# Patient Record
Sex: Male | Born: 1945 | Race: Black or African American | Hispanic: No | Marital: Married | Smoking: Current every day smoker
Health system: Southern US, Community
[De-identification: ages and names within clinical notes are randomized; demographics above are authoritative.]

## PROBLEM LIST (undated history)

## (undated) DIAGNOSIS — I10 Essential (primary) hypertension: Secondary | ICD-10-CM

## (undated) DIAGNOSIS — N189 Chronic kidney disease, unspecified: Secondary | ICD-10-CM

## (undated) DIAGNOSIS — E119 Type 2 diabetes mellitus without complications: Secondary | ICD-10-CM

## (undated) DIAGNOSIS — I517 Cardiomegaly: Secondary | ICD-10-CM

## (undated) HISTORY — DX: Essential (primary) hypertension: I10

## (undated) HISTORY — PX: ANKLE SURGERY: SHX546

## (undated) HISTORY — DX: Chronic kidney disease, unspecified: N18.9

## (undated) HISTORY — DX: Type 2 diabetes mellitus without complications: E11.9

## (undated) HISTORY — DX: Cardiomegaly: I51.7

---

## 1999-01-06 ENCOUNTER — Ambulatory Visit (HOSPITAL_COMMUNITY): Admission: RE | Admit: 1999-01-06 | Discharge: 1999-01-06 | Payer: Self-pay | Admitting: Gastroenterology

## 2000-05-31 ENCOUNTER — Observation Stay (HOSPITAL_COMMUNITY): Admission: EM | Admit: 2000-05-31 | Discharge: 2000-05-31 | Payer: Self-pay | Admitting: Emergency Medicine

## 2000-05-31 ENCOUNTER — Encounter: Payer: Self-pay | Admitting: Emergency Medicine

## 2000-05-31 ENCOUNTER — Encounter: Payer: Self-pay | Admitting: Cardiology

## 2000-06-04 ENCOUNTER — Ambulatory Visit (HOSPITAL_COMMUNITY): Admission: RE | Admit: 2000-06-04 | Discharge: 2000-06-04 | Payer: Self-pay | Admitting: Cardiology

## 2000-06-04 ENCOUNTER — Encounter: Payer: Self-pay | Admitting: Cardiology

## 2005-09-02 ENCOUNTER — Emergency Department (HOSPITAL_COMMUNITY): Admission: EM | Admit: 2005-09-02 | Discharge: 2005-09-03 | Payer: Self-pay | Admitting: *Deleted

## 2005-11-05 ENCOUNTER — Encounter: Admission: RE | Admit: 2005-11-05 | Discharge: 2005-11-05 | Payer: Self-pay | Admitting: Internal Medicine

## 2006-01-13 ENCOUNTER — Emergency Department (HOSPITAL_COMMUNITY): Admission: EM | Admit: 2006-01-13 | Discharge: 2006-01-13 | Payer: Self-pay | Admitting: Family Medicine

## 2006-01-17 ENCOUNTER — Encounter: Admission: RE | Admit: 2006-01-17 | Discharge: 2006-01-17 | Payer: Self-pay | Admitting: Internal Medicine

## 2006-01-30 ENCOUNTER — Encounter: Admission: RE | Admit: 2006-01-30 | Discharge: 2006-01-30 | Payer: Self-pay | Admitting: Internal Medicine

## 2007-08-23 IMAGING — CR DG LUMBAR SPINE COMPLETE 4+V
5 series · 5 of 5 positions shown · non-contrast
Comparison: none

CLINICAL DATA: Severe low back pain and right side pain.   No injury.
 LUMBAR SPINE ? 5 VIEW:

[view not recorded (1 of 5)]
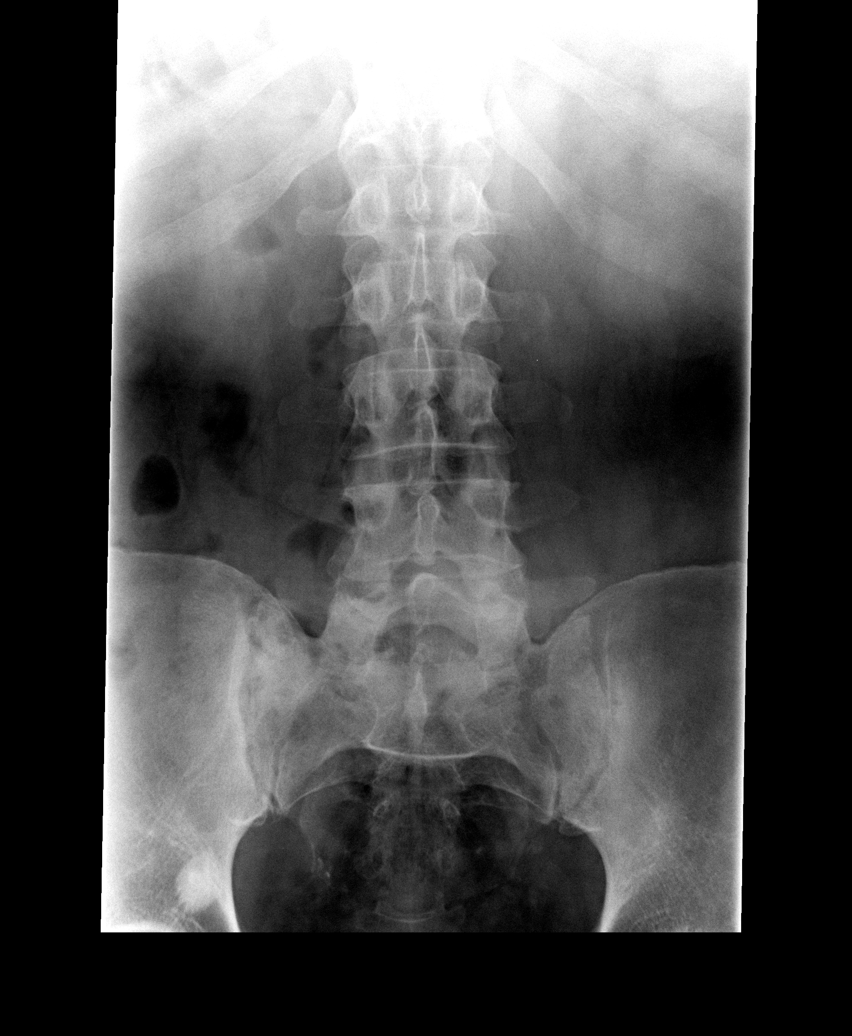

[view not recorded (2 of 5)]
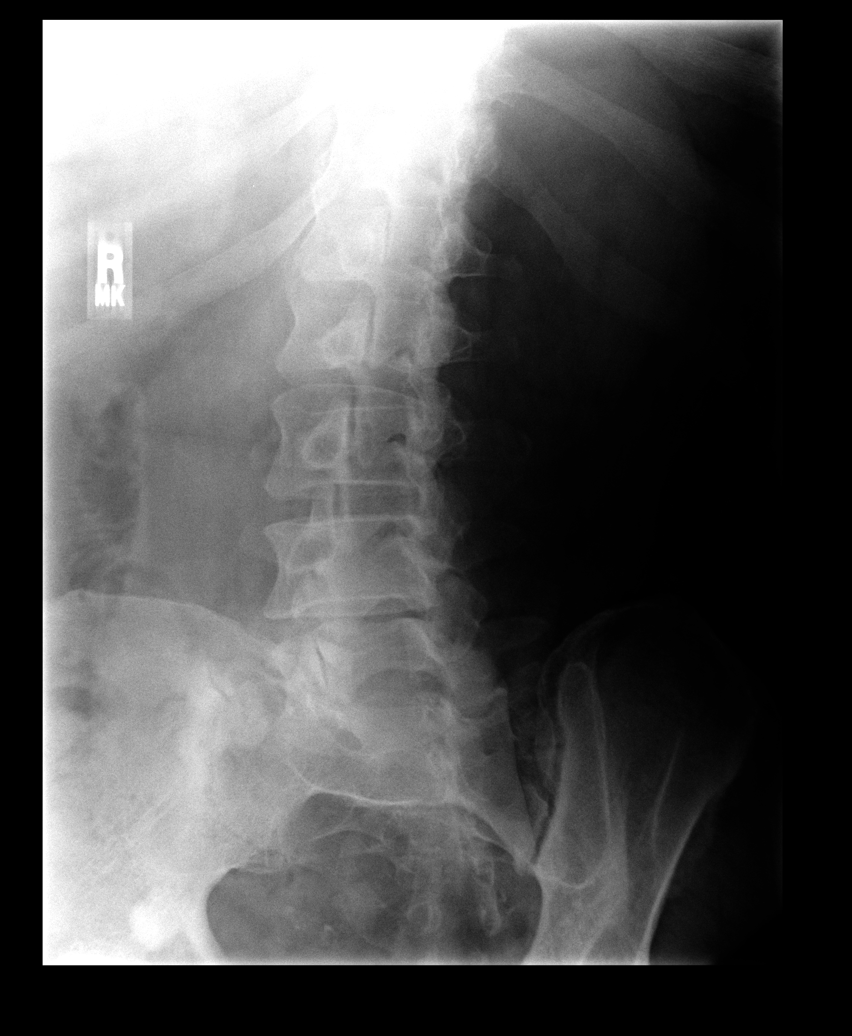

[view not recorded (3 of 5)]
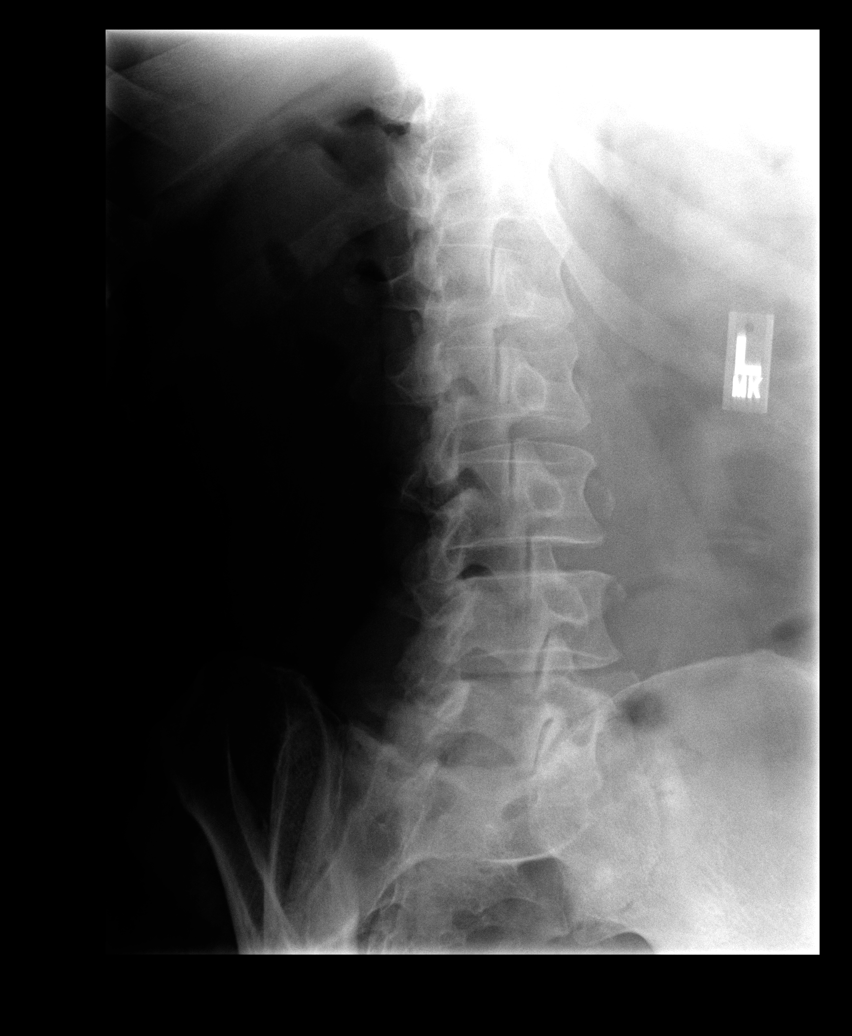

[view not recorded (4 of 5)]
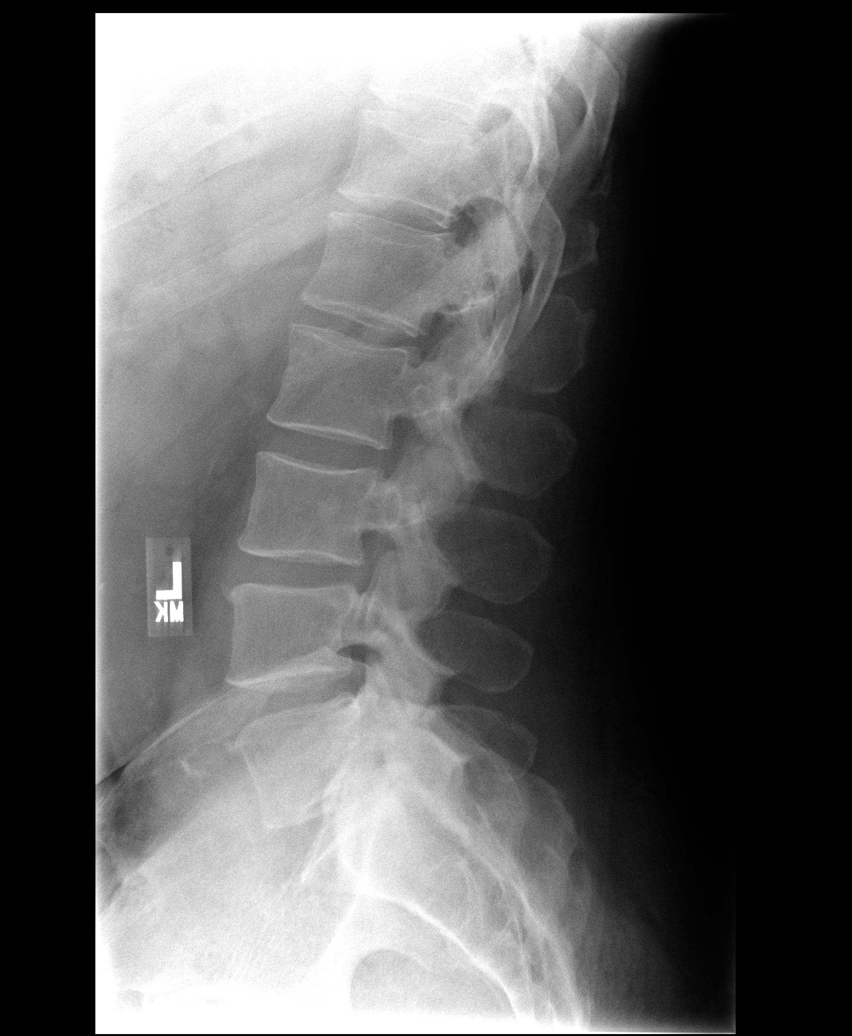

[view not recorded (5 of 5)]
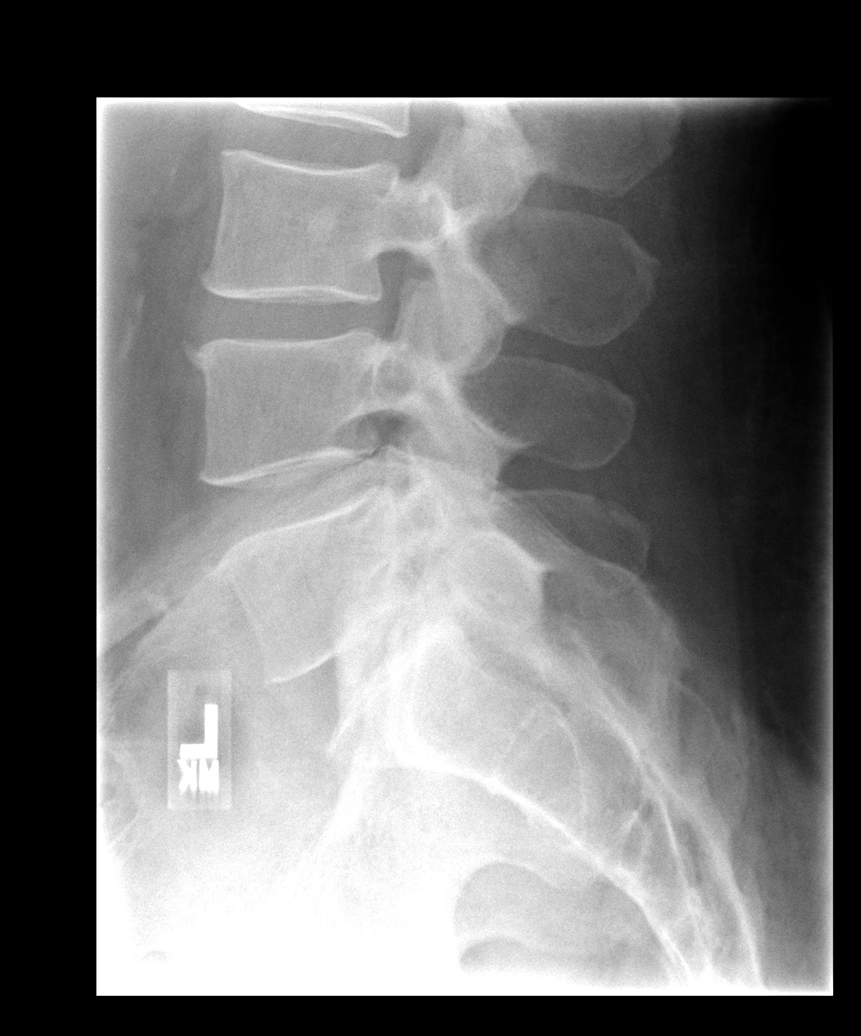

[5 of 5 positions shown; findings below may reference images not displayed]

FINDINGS: There are five lumbar type vertebra.  There is mild L3-4 and L4-5 interspace narrowing.  There are no fractures, subluxations or destructive changes. 
 There is an area of increased density associated with the right sacrum and also medially within the right iliac bone and a subtle area of increased density associated with the L3 vertebral body.  Nuclear medicine bone scan may be helpful in further evaluation to determine if these are associated with increased uptake (Does the patient have a history of prostate cancer?).
IMPRESSION: L3-4 and L4-5 degenerative disk space narrowing.  Areas of increased density associated with the right sacrum, right iliac bone and L3 vertebral body.  Consider nuclear medicine bone scan to determine if these are associated with increased uptake as discussed above.

## 2009-10-20 ENCOUNTER — Ambulatory Visit (HOSPITAL_COMMUNITY): Admission: RE | Admit: 2009-10-20 | Discharge: 2009-10-20 | Payer: Self-pay | Admitting: Gastroenterology

## 2011-02-16 NOTE — H&P (Signed)
[place]  Patient:    Steve Austin, Steve Austin                         MRN: [id number] Adm. Date:  [id number] Attending:  Corliss Marcus Dictator:   Anselm Lis, N.P. CC:         Winn Jock. Earl Gala, M.D.                         History and Physical  PRIMARY CARE Breezie Micucci:  Winn Jock. Earl Gala, M.D.  DATE OF BIRTH:  1946-09-04.  HISTORY OF PRESENT ILLNESS:  [name] is a very pleasant 65 year old, history of hypertension, obesity, "abnormal EKG," hypertensive cardiac disease with cardiac enlargement by serial echocardiograms, last dated April 2000. This gentleman developed initial right low scapular chest pain, which evolved subsequently to epigastric fullness with associated shortness of breath. Notably the discomfort exacerbated with standing erect and markedly eased off with bending over.  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  He was sent to the [facility] Emergency Room about 5:30 this morning and noted easing of symptoms with flat-lying position.  Recurrence only when getting up to urinate, standing erect.  He has no associated neurologic defect. Known cardiac risk factors:  Age, obesity, history of hypertension, possible positive family history of CAD, tobacco use.  PAST MEDICAL HISTORY: 1. Urinary retention, which is improved with Flomax, followed by Dr.    Wanda Plump. 2. History of hypertension for approximately 10 years with associated    hypertensive heart disease; last echo March 2000 revealed moderately severe    LVH with preserved LV function, with EF of 60-70%.  Dilated left atrium at    5.1 cm; dilated aortic root at 4.1, progressed from 3.8, study in 1998. 3. History of abnormal EKG associated with hypertensive heart disease. 4. Right ankle fracture with subsequent surgical repair,  with eight pins    placed. 5. Tobacco use. 6. Chronic lower extremity swelling, for which he takes TED hose, and may be    related to obesity. 7. Obesity.  Denies problems with diabetes mellitus, cancer, asthma, nor thyroid disease.  ALLERGIES:  No known drug allergies.  MEDICATIONS:  Avapro 150 mg p.o. q.d., Norvasc 10 mg p.o. q.d., enteric-coated aspirin 325 mg a day, Flomax q.d. (uncertain dose).  SOCIAL HISTORY:  Tobacco:  One-half pack per day off and on lifelong, more recently for the last five years.  ETOH:  Negative.  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  Married 34 years, has four children, ages 21-34, alive and well.  FAMILY HISTORY:  Fathers whereabouts and health history are unknown.  His mother died at age 31 of "heart problems," onset at age 67s.  One sister who died of AIDS at age 64.  One sister who has survived breast cancer.  REVIEW OF SYSTEMS:  As in HPI and past medical history.  Otherwise, denies problems with lightheadedness, syncope, dizziness, nor near-syncopal episodes. Does wear glasses for reading.  Negative hearing problems.  Good dentition. Negative dysphagia for food or fluids.  No history of GERD symptoms.  No prior history of chest discomfort preceding that at presentation.  Negative constipation, diarrhea, bright red blood p.r., nor melena.  History of urinary retention but improved with the use of Flomax.  Negative hematuria.  Denies arthritic-type complaints.  Chronic lower extremity swelling, improved with TED stockings, which he wears chronically.  PHYSICAL EXAMINATION:  VITAL SIGNS:  Blood pressure is 153/93, subsequent measurement 129/72, respiratory rate 15, heart rate 76, O2 saturation on 2 L nasal cannula is 95-96%.  GENERAL:  He is an obese, middle-aged male in no apparent distress.  His wife is in attendance.  HEENT:  Brisk bilateral carotid upstroke without  bruits.  No significant JVD. No thyromegaly.  CHEST:  Very diminished but clear bilaterally.  Marked exacerbation of anterior chest discomfort with leaning forward.  CARDIAC:  Regular rate and rhythm without murmur, rub, nor gallop.  Normal S1 and S2.  ABDOMEN:  Soft, obese, normoactive bowel sounds.  Negative abdominal aorta, renal or femoral bruit.  Nontender to applied pressure.  Negative masses nor organomegaly appreciated, though difficult exam secondary to obesity.  NEUROLOGIC:  Cranial nerves II-XII grossly intact.  Alert and oriented x 3. Neurologic exam was nonfocal.  GENITAL/RECTAL:  Deferred.  LABORATORY DATA:  A chest x-ray revealed cardiac enlargement, no active disease.  Hemoglobin 14.3, WBC 8, platelets 274.  PT of 12.3, INR 0.9, and PTT of 30.  Sodium 142, potassium 3.6, chloride 105, CO2 18, creatinine 1.4, glucose 99, BUN 18.  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  ST upsloping with asymmetric T-wave inversion V3.  ST depression of 1 mm V3 with asymmetric T-wave inversion.  ST depression with slightly downsloping ST segment and asymmetric T-wave inversion V5, V6, inferiorly.  T-wave inversion V1, and T-wave widening aVL.  IMPRESSION: 1. Atypical chest pain in a 65 year old with history of hypertension, tobacco    use, and possible family history for coronary artery disease.  EKG with    T-wave abnormalities which are old.  He does have a history of left    ventricular hypertrophy.  The pain starts in the back and radiates to chest    when standing. 2. Tobacco use. 3. Hypertension. 4. Urinary retention, improved with Flomax.  PLAN: 1. Obtain a chest CT to rule out pulmonary emboli and rule out aortic    dissection or other aortic pathology. 2. Admit to telemetry, rule out myocardial ischemia/infarction with serial    cardiac enzymes and daily EKGs. 3. If CT is negative, will evaluate further for evidence of coronary  artery    disease.DD:  05/31/00 TD:  05/31/00 Job: [id number]

## 2013-10-16 ENCOUNTER — Encounter (INDEPENDENT_AMBULATORY_CARE_PROVIDER_SITE_OTHER): Payer: Self-pay | Admitting: Surgery

## 2013-10-20 ENCOUNTER — Encounter (INDEPENDENT_AMBULATORY_CARE_PROVIDER_SITE_OTHER): Payer: Self-pay | Admitting: Surgery

## 2013-10-20 ENCOUNTER — Ambulatory Visit (INDEPENDENT_AMBULATORY_CARE_PROVIDER_SITE_OTHER): Payer: Medicare Other | Admitting: Surgery

## 2013-10-20 ENCOUNTER — Encounter (INDEPENDENT_AMBULATORY_CARE_PROVIDER_SITE_OTHER): Payer: Self-pay

## 2013-10-20 VITALS — BP 160/100 | HR 108 | Temp 97.5°F | Resp 15 | Ht 76.0 in | Wt 274.4 lb

## 2013-10-20 DIAGNOSIS — D1779 Benign lipomatous neoplasm of other sites: Secondary | ICD-10-CM

## 2013-10-20 DIAGNOSIS — D171 Benign lipomatous neoplasm of skin and subcutaneous tissue of trunk: Secondary | ICD-10-CM | POA: Insufficient documentation

## 2013-10-20 NOTE — Progress Notes (Signed)
Patient ID: Steve Austin, male   DOB: [birth year], 68 y.o.   MRN: [id number]  Chief Complaint  Patient presents with  . New Evaluation    eval Lipoma on back    HPI Steve Austin is a 68 y.o. male.  Referred by Dr. Carlena Sax for evaluation of lipoma of the back  HPI This is a 68 year old male who presents with several years of an enlarging mass on the upper part of his back near the base of his neck. This has caused some discomfort. His wife seems to think that it is getting larger and more noticeable. This has never become infected. He presents now to discuss excision of this area. Past Medical History  Diagnosis Date  . Diabetes mellitus without complication   . Chronic kidney disease   . Hypertension   . LVH (left ventricular hypertrophy)     PSH;  Ankle surgery  History reviewed. No pertinent family history.  Social History History  Substance Use Topics  . Smoking status: Current Every Day Smoker -- 0.50 packs/day    Types: Cigarettes  . Smokeless tobacco: Never Used  . Alcohol Use: No    No Known Allergies  Current Outpatient Prescriptions  Medication Sig Dispense Refill  . amLODipine (NORVASC) 10 MG tablet Take 10 mg by mouth daily.      Marland Kitchen aspirin 81 MG tablet Take 81 mg by mouth daily.      Marland Kitchen atorvastatin (LIPITOR) 20 MG tablet Take 20 mg by mouth daily.      . fluticasone (FLONASE) 50 MCG/ACT nasal spray Place into both nostrils daily.      Marland Kitchen glucose monitoring kit (FREESTYLE) monitoring kit 1 each by Does not apply route as needed for other.      . losartan (COZAAR) 100 MG tablet Take 100 mg by mouth daily.      . metFORMIN (GLUCOPHAGE) 500 MG tablet Take by mouth 2 (two) times daily with a meal.      . mirabegron ER (MYRBETRIQ) 25 MG TB24 tablet Take 25 mg by mouth daily.       No current facility-administered medications for this visit.    Review of Systems Review of Systems  Constitutional: Negative for fever, chills and unexpected weight change.   HENT: Negative for congestion, hearing loss, sore throat, trouble swallowing and voice change.   Eyes: Negative for visual disturbance.  Respiratory: Negative for cough and wheezing.   Cardiovascular: Negative for chest pain, palpitations and leg swelling.  Gastrointestinal: Negative for nausea, vomiting, abdominal pain, diarrhea, constipation, blood in stool, abdominal distention, anal bleeding and rectal pain.  Genitourinary: Negative for hematuria and difficulty urinating.  Musculoskeletal: Negative for arthralgias.  Skin: Negative for rash and wound.  Neurological: Negative for seizures, syncope, weakness and headaches.  Hematological: Negative for adenopathy. Does not bruise/bleed easily.  Psychiatric/Behavioral: Negative for confusion.    Blood pressure 160/100, pulse 108, temperature 97.5 F (36.4 C), temperature source Temporal, resp. rate 15, height $RemoveBe'6\' 4"'pEBqJNOEo$  (1.93 m), weight 274 lb 6.4 oz (124.467 kg).  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  The surgical procedure has been discussed with the patient.  Potential risks, benefits, alternative treatments, and expected outcomes have been explained.  All of the patient's questions at this time have been answered.  The likelihood of reaching the patient's treatment goal is good.  The patient understand the proposed surgical procedure and wishes to proceed.         Raygen Dahm K. 10/20/2013, 12:42 PM

## 2013-10-29 ENCOUNTER — Encounter (INDEPENDENT_AMBULATORY_CARE_PROVIDER_SITE_OTHER): Payer: Self-pay

## 2013-11-03 ENCOUNTER — Telehealth (INDEPENDENT_AMBULATORY_CARE_PROVIDER_SITE_OTHER): Payer: Self-pay | Admitting: General Surgery

## 2013-11-03 NOTE — Telephone Encounter (Signed)
Dr. Maxwell Caul office just called Steve Austin Dr Maxwell Caul nurse and she going get the patient an apt to see heart doctor for cardiac clearance, she stated that she will schedule him at [facility] heart care. And call me back and she will call the patient

## 2013-11-03 NOTE — Telephone Encounter (Signed)
Called patient this morning and he stated that he called Dr Maxwell Caul and he is going to see if Dr Maxwell Caul going to get him to see a heart doctor. I told Mr. Steve Austin that Dr Fransico Him read his EKG and she may be the one who will clear him for surgery, I told the patient to call me back and ask for [facility] and I will pass this on to Dr Georgette Dover

## 2013-11-04 ENCOUNTER — Encounter: Payer: Self-pay | Admitting: Internal Medicine

## 2013-11-04 ENCOUNTER — Ambulatory Visit (INDEPENDENT_AMBULATORY_CARE_PROVIDER_SITE_OTHER): Payer: Medicare Other | Admitting: Internal Medicine

## 2013-11-04 VITALS — BP 140/80 | HR 81 | Ht 75.0 in | Wt 273.2 lb

## 2013-11-04 DIAGNOSIS — I10 Essential (primary) hypertension: Secondary | ICD-10-CM

## 2013-11-04 DIAGNOSIS — Z01810 Encounter for preprocedural cardiovascular examination: Secondary | ICD-10-CM

## 2013-11-04 DIAGNOSIS — R0602 Shortness of breath: Secondary | ICD-10-CM

## 2013-11-04 DIAGNOSIS — E669 Obesity, unspecified: Secondary | ICD-10-CM | POA: Insufficient documentation

## 2013-11-04 DIAGNOSIS — R9431 Abnormal electrocardiogram [ECG] [EKG]: Secondary | ICD-10-CM | POA: Insufficient documentation

## 2013-11-04 DIAGNOSIS — I517 Cardiomegaly: Secondary | ICD-10-CM

## 2013-11-04 DIAGNOSIS — E785 Hyperlipidemia, unspecified: Secondary | ICD-10-CM

## 2013-11-04 DIAGNOSIS — R0989 Other specified symptoms and signs involving the circulatory and respiratory systems: Secondary | ICD-10-CM

## 2013-11-04 DIAGNOSIS — Z720 Tobacco use: Secondary | ICD-10-CM | POA: Insufficient documentation

## 2013-11-04 DIAGNOSIS — E66811 Obesity, class 1: Secondary | ICD-10-CM | POA: Insufficient documentation

## 2013-11-04 DIAGNOSIS — R0609 Other forms of dyspnea: Secondary | ICD-10-CM | POA: Insufficient documentation

## 2013-11-04 NOTE — Patient Instructions (Signed)
Your physician has requested that you have an echocardiogram. Echocardiography is a painless test that uses sound waves to create images of your heart. It provides your doctor with information about the size and shape of your heart and how well your heart's chambers and valves are working. This procedure takes approximately one hour. There are no restrictions for this procedure.  Your physician has requested that you have en exercise stress myoview. For further information please visit [email or web address]. Please follow instruction sheet, as given.  Your physician recommends that you schedule a follow-up appointment in: 2-3 weeks, after your tests.

## 2013-11-04 NOTE — Progress Notes (Signed)
OFFICE NOTE  Chief Complaint:  Pre-operative exam, abnormal EKG  Primary Care Physician: Horton Finer, MD  HPI:  Steve Austin is a pleasant 68 year old male with a history of hypertension, dyslipidemia, DM2, tobacco abuse which is ongoing, obesity, and family history of coronary disease. He was scheduled for a lipoma removal surgery and had an EKG which was abnormal. His surgery was then halted with plans for preoperative cardiovascular risk assessment. Steve Austin reports it is had an abnormal EKG in the past and in fact in 1998 he had an episode of shortness of breath and chest discomfort. He underwent a stress test around that time which was negative for ischemia but showed reduced EF of 43%.  He reports that subsequently he had echocardiograms through his primary care provider's office, but those results are not available today. His last study was over 5 years ago. He does not do much exertion, but does do some housework and maintains his church. He reports some shortness of breath with marked exertion.  His EKG was reviewed today and is markedly abnormal demonstrating voltage criteria for LVH and T-wave inversions which are deep in leads V3 through V6 and II, III, and AVF. He denies any chest pain with exertion, but again he does not do any significant physical activity.  PMHx:  Past Medical History  Diagnosis Date  . Diabetes mellitus without complication   . Chronic kidney disease   . Hypertension   . LVH (left ventricular hypertrophy)     Past Surgical History  Procedure Laterality Date  . Ankle surgery Right     FAMHx:  Family History  Problem Relation Age of Onset  . Heart disease Mother   . Hypertension Mother   . Breast cancer Paternal Grandmother   . Breast cancer Sister   . Diabetes Sister   . Breast cancer Daughter   . Valvular heart disease Son 48    aortic aneurysm repair    SOCHx:   reports that he has been smoking Cigarettes.  He has a 17.5  pack-year smoking history. He has never used smokeless tobacco. He reports that he does not drink alcohol or use illicit drugs.  ALLERGIES:  No Known Allergies  ROS: A comprehensive review of systems was negative except for: Respiratory: positive for dyspnea on exertion Integument/breast: positive for lipoma  HOME MEDS: Current Outpatient Prescriptions  Medication Sig Dispense Refill  . amLODipine (NORVASC) 10 MG tablet Take 10 mg by mouth daily.      Marland Kitchen aspirin 81 MG tablet Take 81 mg by mouth daily.      Marland Kitchen atorvastatin (LIPITOR) 20 MG tablet Take 20 mg by mouth daily.      Marland Kitchen darifenacin (ENABLEX) 15 MG 24 hr tablet Take 15 mg by mouth daily.      . fluticasone (FLONASE) 50 MCG/ACT nasal spray Place into both nostrils daily.      Marland Kitchen glucose monitoring kit (FREESTYLE) monitoring kit 1 each by Does not apply route as needed for other.      . losartan (COZAAR) 100 MG tablet Take 100 mg by mouth daily.      . metFORMIN (GLUCOPHAGE) 500 MG tablet Take by mouth 2 (two) times daily with a meal.       No current facility-administered medications for this visit.    LABS/IMAGING: No results found for this or any previous visit (from the past 48 hour(s)). No results found.  VITALS: BP 140/80  Pulse 81  Ht _0  (  1.905 m)  Wt 273 lb 3.2 oz (123.923 kg)  BMI 34.15 kg/m2  EXAM: General appearance: alert and no distress Neck: no carotid bruit, no JVD and lipoma over left trapezius area Lungs: clear to auscultation bilaterally Heart: regular rate and rhythm, S1, S2 normal, no murmur, click, rub or gallop Abdomen: soft, non-tender; bowel sounds normal; no masses,  no organomegaly and obese Extremities: edema trace and venous stasis dermatitis noted Pulses: 2+ and symmetric Skin: dark skin discoloration over shins Neurologic: Grossly normal Psych: Pleasant, normal  EKG: Normal sinus rhythm at 81, ALT is criteria for LVH, deep T-wave inversions anteriorly, laterally and  inferiorly  ASSESSMENT: 1. Indeterminate risk for surgery 2. Hypertension 3. Dyslipidemia 4. Tobacco abuse 5. Abnormal EKG suggesting possible ischemia versus LVH with repolarization abnormalities, consider variant apical hypertrophic cardiomyopathy 6. Obesity 7. DM2  PLAN: 1.   Steve Austin has a number of cardiac risk factors and an abnormal EKG which could be explained by ischemia or LVH with repolarization abnormalities. He could also have a variant of hypertrophic cardiomyopathy. We will try to obtain old echocardiograms from his primary care provider. However the studies are not recent and therefore would recommend a repeat echocardiogram, in light of the abnormal EKG, his need for upcoming surgery, and shortness of breath with exertion. He also should have an ischemia evaluation based on these findings and I would recommend an exercise nuclear stress test. We started to discuss smoking cessation today and we'll talk about it more at his followup visit.  We also discussed the need for exercise, weight loss and more activity.  I will be in contact with the results of his cardiac work-up. Thanks for referring him.  Steve Casino, MD, [facility] Attending Cardiologist [facility]  Steve Austin 11/04/2013, 8:39 AM

## 2013-11-11 ENCOUNTER — Ambulatory Visit (HOSPITAL_COMMUNITY)
Admission: RE | Admit: 2013-11-11 | Discharge: 2013-11-11 | Disposition: A | Discharge status: Home / Self Care | Payer: Medicare Other | Source: Ambulatory Visit | Attending: Cardiovascular Disease | Admitting: Cardiovascular Disease

## 2013-11-11 DIAGNOSIS — E785 Hyperlipidemia, unspecified: Secondary | ICD-10-CM

## 2013-11-11 DIAGNOSIS — Z8249 Family history of ischemic heart disease and other diseases of the circulatory system: Secondary | ICD-10-CM | POA: Insufficient documentation

## 2013-11-11 DIAGNOSIS — E669 Obesity, unspecified: Secondary | ICD-10-CM | POA: Insufficient documentation

## 2013-11-11 DIAGNOSIS — R9431 Abnormal electrocardiogram [ECG] [EKG]: Secondary | ICD-10-CM

## 2013-11-11 DIAGNOSIS — I129 Hypertensive chronic kidney disease with stage 1 through stage 4 chronic kidney disease, or unspecified chronic kidney disease: Secondary | ICD-10-CM | POA: Insufficient documentation

## 2013-11-11 DIAGNOSIS — R0989 Other specified symptoms and signs involving the circulatory and respiratory systems: Secondary | ICD-10-CM | POA: Insufficient documentation

## 2013-11-11 DIAGNOSIS — R0609 Other forms of dyspnea: Secondary | ICD-10-CM | POA: Insufficient documentation

## 2013-11-11 DIAGNOSIS — F17200 Nicotine dependence, unspecified, uncomplicated: Secondary | ICD-10-CM | POA: Insufficient documentation

## 2013-11-11 DIAGNOSIS — Z01810 Encounter for preprocedural cardiovascular examination: Secondary | ICD-10-CM

## 2013-11-11 DIAGNOSIS — E119 Type 2 diabetes mellitus without complications: Secondary | ICD-10-CM | POA: Insufficient documentation

## 2013-11-11 DIAGNOSIS — I10 Essential (primary) hypertension: Secondary | ICD-10-CM

## 2013-11-11 DIAGNOSIS — R0602 Shortness of breath: Secondary | ICD-10-CM

## 2013-11-11 DIAGNOSIS — N189 Chronic kidney disease, unspecified: Secondary | ICD-10-CM | POA: Insufficient documentation

## 2013-11-11 MED ORDER — TECHNETIUM TC 99M SESTAMIBI GENERIC - CARDIOLITE
29.0000 | Freq: Once | INTRAVENOUS | Status: AC | PRN
  Administered 2013-11-11: 29 via INTRAVENOUS

## 2013-11-11 MED ORDER — TECHNETIUM TC 99M SESTAMIBI GENERIC - CARDIOLITE
9.0000 | Freq: Once | INTRAVENOUS | Status: AC | PRN
  Administered 2013-11-11: 9 via INTRAVENOUS

## 2013-11-11 NOTE — Procedures (Addendum)
[place] [facility] [address] [place] [address] [phone]  Cardiology Nuclear Med Study  Steve Austin is a 68 y.o. male     MRN : [id number]     DOB: 07/13/1946  Procedure Date: 11/11/2013  Nuclear Med Background Indication for Stress Test:  Surgical Clearance and Abnormal EKG History:  CKD;lipoma Cardiac Risk Factors: Family History - CAD, Hypertension, Lipids, NIDDM, Obesity and Smoker  Symptoms:  DOE and SOB   Nuclear Pre-Procedure Caffeine/Decaff Intake:  9:00pm NPO After: 7:00am   IV Site: R Hand  IV 0.9% NS with Angio Cath:  22g  Chest Size (in):  48"  IV Started by: Azucena Cecil, RN  Height: 6\' 3"  (1.905 m)  Cup Size: n/a  BMI:  Body mass index is 34.12 kg/(m^2). Weight:  273 lb (123.832 kg)   Tech Comments:  n/a    Nuclear Med Study 1 or 2 day study: 1 day  Stress Test Type:  Stress  Order Authorizing Provider:  Lyman Bishop, MD   Resting Radionuclide: Technetium 98m Sestamibi  Resting Radionuclide Dose: 9.0 mCi   Stress Radionuclide:  Technetium 4m Sestamibi  Stress Radionuclide Dose: 29.0 mCi           Stress Protocol Rest HR: 66 Stress HR: 153  Rest BP: 171/105 Stress BP: 219/99  Exercise Time (min): 5:41 METS: 6.30   Predicted Max HR: 154 bpm % Max HR: 99.35 bpm Rate Pressure Product: 33507  Dose of Adenosine (mg):  n/a Dose of Lexiscan: n/a mg  Dose of Atropine (mg): n/a Dose of Dobutamine: n/a mcg/kg/min (at max HR)  Stress Test Technologist: Mellody Memos, CCT Nuclear Technologist: Imagene Riches, CNMT   Rest Procedure:  Myocardial perfusion imaging was performed at rest 45 minutes following the intravenous administration of Technetium 79m Sestamibi. Stress Procedure:  The patient performed treadmill exercise using a Bruce  Protocol for 5 minutes and 41 seconds. The patient stopped due to extreme shortness of breath and fatigue. Patient denied any chest pain.  There were no significant ST-T  wave changes.  Technetium 5m Sestamibi was injected at peak exercise and myocardial perfusion imaging was performed after a brief delay.  Transient Ischemic Dilatation (Normal <1.22):  0.78 Lung/Heart Ratio (Normal <0.45):  0.21 QGS EDV: 141 ml QGS ESV:  79 ml LV Ejection Fraction: 44%  Rest ECG: NSR - Normal EKG  Stress ECG: No significant change from baseline ECG  QPS Raw Data Images:  There is interference from nuclear activity from structures below the diaphragm. This does not affect the ability to read the study. Stress Images:  There is decreased uptake in the inferior wall. Rest Images:  There is decreased uptake in the inferior wall. Subtraction (SDS):  fixed inferior defect  Impression Exercise Capacity:  Fair exercise capacity. BP Response:  Hypertensive blood pressure response. Clinical Symptoms:  Fatigue and extreme dyspnea ECG Impression:  There are scattered PVCs. Comparison with Prior Nuclear Study: No images to compare  Overall Impression:  Intermediate risk stress nuclear study with a fixed inferior defect that appears to be scar..  LV Wall Motion:  Inferior hypokinesis - EF 43%.  Pixie Casino, MD, [facility] Board Certified in Nuclear Cardiology Attending Cardiologist [place], MD  11/11/2013 1:21 PM

## 2013-11-16 ENCOUNTER — Ambulatory Visit (HOSPITAL_COMMUNITY)
Admission: RE | Admit: 2013-11-16 | Discharge: 2013-11-16 | Disposition: A | Discharge status: Home / Self Care | Payer: Medicare Other | Source: Ambulatory Visit | Attending: Internal Medicine | Admitting: Internal Medicine

## 2013-11-16 DIAGNOSIS — R0989 Other specified symptoms and signs involving the circulatory and respiratory systems: Secondary | ICD-10-CM | POA: Insufficient documentation

## 2013-11-16 DIAGNOSIS — I379 Nonrheumatic pulmonary valve disorder, unspecified: Secondary | ICD-10-CM | POA: Insufficient documentation

## 2013-11-16 DIAGNOSIS — E119 Type 2 diabetes mellitus without complications: Secondary | ICD-10-CM | POA: Insufficient documentation

## 2013-11-16 DIAGNOSIS — I10 Essential (primary) hypertension: Secondary | ICD-10-CM | POA: Insufficient documentation

## 2013-11-16 DIAGNOSIS — I517 Cardiomegaly: Secondary | ICD-10-CM

## 2013-11-16 DIAGNOSIS — R0602 Shortness of breath: Secondary | ICD-10-CM

## 2013-11-16 DIAGNOSIS — R9431 Abnormal electrocardiogram [ECG] [EKG]: Secondary | ICD-10-CM

## 2013-11-16 DIAGNOSIS — R0609 Other forms of dyspnea: Secondary | ICD-10-CM | POA: Insufficient documentation

## 2013-11-16 DIAGNOSIS — F17200 Nicotine dependence, unspecified, uncomplicated: Secondary | ICD-10-CM | POA: Insufficient documentation

## 2013-11-16 DIAGNOSIS — I059 Rheumatic mitral valve disease, unspecified: Secondary | ICD-10-CM | POA: Insufficient documentation

## 2013-11-16 DIAGNOSIS — E785 Hyperlipidemia, unspecified: Secondary | ICD-10-CM | POA: Insufficient documentation

## 2013-11-16 DIAGNOSIS — I319 Disease of pericardium, unspecified: Secondary | ICD-10-CM | POA: Insufficient documentation

## 2013-11-16 DIAGNOSIS — Z01810 Encounter for preprocedural cardiovascular examination: Secondary | ICD-10-CM

## 2013-11-16 NOTE — Progress Notes (Signed)
2D Echo Performed 11/16/2013    Richrd Kuzniar, RCS  

## 2013-11-18 ENCOUNTER — Encounter (INDEPENDENT_AMBULATORY_CARE_PROVIDER_SITE_OTHER): Payer: Medicare Other | Admitting: Surgery

## 2013-11-20 ENCOUNTER — Ambulatory Visit (INDEPENDENT_AMBULATORY_CARE_PROVIDER_SITE_OTHER): Payer: Medicare Other | Admitting: Internal Medicine

## 2013-11-20 ENCOUNTER — Encounter: Payer: Self-pay | Admitting: Internal Medicine

## 2013-11-20 VITALS — BP 164/92 | HR 72 | Ht 76.0 in | Wt 278.0 lb

## 2013-11-20 DIAGNOSIS — E785 Hyperlipidemia, unspecified: Secondary | ICD-10-CM

## 2013-11-20 DIAGNOSIS — Z01810 Encounter for preprocedural cardiovascular examination: Secondary | ICD-10-CM

## 2013-11-20 DIAGNOSIS — R9431 Abnormal electrocardiogram [ECG] [EKG]: Secondary | ICD-10-CM

## 2013-11-20 DIAGNOSIS — R0609 Other forms of dyspnea: Secondary | ICD-10-CM

## 2013-11-20 DIAGNOSIS — I10 Essential (primary) hypertension: Secondary | ICD-10-CM

## 2013-11-20 DIAGNOSIS — R0989 Other specified symptoms and signs involving the circulatory and respiratory systems: Secondary | ICD-10-CM

## 2013-11-20 MED ORDER — VALSARTAN-HYDROCHLOROTHIAZIDE 160-12.5 MG PO TABS: 1.0000 | ORAL_TABLET | Freq: Every day | ORAL | Status: AC

## 2013-11-20 NOTE — Patient Instructions (Signed)
Your physician has recommended you make the following change in your medication: STOP losartan. START valsartan-hct 160-12.5mg  once daily.   Your physician recommends that you schedule a follow-up appointment after your surgery.

## 2013-11-23 ENCOUNTER — Encounter: Payer: Self-pay | Admitting: Internal Medicine

## 2013-11-23 NOTE — Progress Notes (Signed)
OFFICE NOTE  Chief Complaint:  Pre-operative exam, abnormal EKG  Primary Care Physician: Horton Finer, MD  HPI:  Steve Austin is a pleasant 68 year old male with a history of hypertension, dyslipidemia, DM2, tobacco abuse which is ongoing, obesity, and family history of coronary disease. He was scheduled for a lipoma removal surgery and had an EKG which was abnormal. His surgery was then halted with plans for preoperative cardiovascular risk assessment. Steve Austin reports it is had an abnormal EKG in the past and in fact in 1998 he had an episode of shortness of breath and chest discomfort. He underwent a stress test around that time which was negative for ischemia but showed reduced EF of 43%.  He reports that subsequently he had echocardiograms through his primary care provider's office, but those results are not available today. His last study was over 5 years ago. He does not do much exertion, but does do some housework and maintains his church. He reports some shortness of breath with marked exertion.  His EKG was reviewed today and is markedly abnormal demonstrating voltage criteria for LVH and T-wave inversions which are deep in leads V3 through V6 and II, III, and AVF. He denies any chest pain with exertion, but again he does not do any significant physical activity.  Steve Austin to returns today to followup on his echo and nuclear stress test.  His nuclear stress test again shows a fixed inferior defect which was interpreted as scar with EF of 44%, although there was more global hypokinesis. On echocardiogram however EF was 55-60% and there were no regional wall motion abnormalities. There was grade 1 diastolic dysfunction and a trivial posterior pericardial effusion.  PMHx:  Past Medical History  Diagnosis Date  . Diabetes mellitus without complication   . Chronic kidney disease   . Hypertension   . LVH (left ventricular hypertrophy)     Past Surgical History    Procedure Laterality Date  . Ankle surgery Right     FAMHx:  Family History  Problem Relation Age of Onset  . Heart disease Mother   . Hypertension Mother   . Breast cancer Paternal Grandmother   . Breast cancer Sister   . Diabetes Sister   . Breast cancer Daughter   . Valvular heart disease Son 5    aortic aneurysm repair    SOCHx:   reports that he has been smoking Cigarettes.  He has a 17.5 pack-year smoking history. He has never used smokeless tobacco. He reports that he does not drink alcohol or use illicit drugs.  ALLERGIES:  No Known Allergies  ROS: A comprehensive review of systems was negative except for: Respiratory: positive for dyspnea on exertion Integument/breast: positive for lipoma  HOME MEDS: Current Outpatient Prescriptions  Medication Sig Dispense Refill  . amLODipine (NORVASC) 10 MG tablet Take 10 mg by mouth daily.      Marland Kitchen aspirin 81 MG tablet Take 81 mg by mouth daily.      Marland Kitchen atorvastatin (LIPITOR) 20 MG tablet Take 20 mg by mouth daily.      Marland Kitchen darifenacin (ENABLEX) 15 MG 24 hr tablet Take 15 mg by mouth daily.      . fluticasone (FLONASE) 50 MCG/ACT nasal spray Place into both nostrils daily.      Marland Kitchen glucose monitoring kit (FREESTYLE) monitoring kit 1 each by Does not apply route as needed for other.      . metFORMIN (GLUCOPHAGE) 500 MG tablet Take by mouth  2 (two) times daily with a meal.      . valsartan-hydrochlorothiazide (DIOVAN-HCT) 160-12.5 MG per tablet Take 1 tablet by mouth daily.  30 tablet  6   No current facility-administered medications for this visit.    LABS/IMAGING: No results found for this or any previous visit (from the past 48 hour(s)). No results found.  VITALS: BP 164/92  Pulse 72  Ht 6' 4"  (1.93 m)  Wt 278 lb (126.1 kg)  BMI 33.85 kg/m2  EXAM: deferred  EKG: deferred  ASSESSMENT: 1. Low risk for surgery 2. Hypertension 3. Dyslipidemia 4. Tobacco abuse 5. 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 Obesity 7. DM2  PLAN: 1.   Steve Austin had a low risk stress test which showed an inferior defect which I suspect is artifact. His echocardiogram showed a preserved EF with no wall motion abnormalities. At this point, I would classify him as a low risk for surgery regarding a lipoma removal. I recommend proceeding onward with surgery.  I do feel though that his blood pressure is not adequately controlled.  I would recommend discontinuing losartan and starting valsartan and HCTZ 160/12.5 mg daily.  I like to followup with him at least annually based on his risk factors.  Thank you again for allowing me to participate in his care.  Pixie Casino, MD, [facility] Attending Cardiologist [facility]  Shereese Bonnie C 11/23/2013, 6:26 PM

## 2013-11-27 ENCOUNTER — Encounter (INDEPENDENT_AMBULATORY_CARE_PROVIDER_SITE_OTHER): Payer: Self-pay

## 2013-12-07 ENCOUNTER — Telehealth (INDEPENDENT_AMBULATORY_CARE_PROVIDER_SITE_OTHER): Payer: Self-pay | Admitting: Surgery

## 2013-12-07 NOTE — Telephone Encounter (Signed)
Pt wants to reschedule surgery states he has seen cardiologist  Do we have his CC?

## 2013-12-08 NOTE — Telephone Encounter (Signed)
I signed his cardiac clearance last week.  Deneise Lever should have it as well as the orders.  Go ahead and schedule it.

## 2013-12-16 ENCOUNTER — Other Ambulatory Visit (INDEPENDENT_AMBULATORY_CARE_PROVIDER_SITE_OTHER): Payer: Self-pay | Admitting: Surgery

## 2013-12-16 ENCOUNTER — Other Ambulatory Visit (INDEPENDENT_AMBULATORY_CARE_PROVIDER_SITE_OTHER): Payer: Self-pay | Admitting: *Deleted

## 2013-12-16 DIAGNOSIS — D1739 Benign lipomatous neoplasm of skin and subcutaneous tissue of other sites: Secondary | ICD-10-CM

## 2013-12-16 MED ORDER — OXYCODONE-ACETAMINOPHEN 5-325 MG PO TABS: 1.0000 | ORAL_TABLET | ORAL | Status: DC | PRN

## 2013-12-17 ENCOUNTER — Telehealth (INDEPENDENT_AMBULATORY_CARE_PROVIDER_SITE_OTHER): Payer: Self-pay

## 2013-12-17 NOTE — Telephone Encounter (Signed)
Pt called asking if he needs an appointment for suture removal. Op note is not available yet so I am not sure if sutures are dissolvable or need to be removed. Please advise.

## 2013-12-17 NOTE — Telephone Encounter (Signed)
Called patient back and I talked to his wife and she checked his back to see if the sutures are inside or outside. And she stated that they where on the inside and I told her that we do not remove them and they will dissolve on the inside and that he will need to keep his apt with Dr Georgette Dover on 12-29-13

## 2013-12-29 ENCOUNTER — Ambulatory Visit (INDEPENDENT_AMBULATORY_CARE_PROVIDER_SITE_OTHER): Payer: Medicare Other | Admitting: Surgery

## 2013-12-29 ENCOUNTER — Encounter (INDEPENDENT_AMBULATORY_CARE_PROVIDER_SITE_OTHER): Payer: Self-pay | Admitting: Surgery

## 2013-12-29 VITALS — BP 150/88 | HR 80 | Temp 98.3°F | Resp 16 | Wt 274.0 lb

## 2013-12-29 DIAGNOSIS — D1779 Benign lipomatous neoplasm of other sites: Secondary | ICD-10-CM

## 2013-12-29 DIAGNOSIS — D171 Benign lipomatous neoplasm of skin and subcutaneous tissue of trunk: Secondary | ICD-10-CM

## 2013-12-29 NOTE — Progress Notes (Signed)
Status post excision of large lipoma from the upper back on 12/16/13. The incision is well-healed with no sign of infection or seroma. The patient had minimal pain after surgery. The pathology confirmed a benign lipoma. We may resume full activity. Followup when necessary.  Imogene Burn. Georgette Dover, MD, [facility]  General/ Trauma Surgery  12/29/2013 1:42 PM

## 2014-08-01 DEATH — deceased
